# Patient Record
Sex: Female | Born: 1995 | Race: Black or African American | Hispanic: No | Marital: Single | Smoking: Never smoker
Health system: Southern US, Community
[De-identification: ages and names within clinical notes are randomized; demographics above are authoritative.]

## PROBLEM LIST (undated history)

## (undated) DIAGNOSIS — Z803 Family history of malignant neoplasm of breast: Secondary | ICD-10-CM

## (undated) DIAGNOSIS — Z8481 Family history of carrier of genetic disease: Secondary | ICD-10-CM

## (undated) HISTORY — DX: Family history of carrier of genetic disease: Z84.81

## (undated) HISTORY — DX: Family history of malignant neoplasm of breast: Z80.3

---
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
## 2015-01-13 ENCOUNTER — Encounter (HOSPITAL_COMMUNITY): Payer: Self-pay | Admitting: Emergency Medicine

## 2015-01-13 ENCOUNTER — Emergency Department (HOSPITAL_COMMUNITY)
Admission: EM | Admit: 2015-01-13 | Discharge: 2015-01-13 | Disposition: A | Discharge status: Home / Self Care | Payer: Medicaid Other | Attending: Emergency Medicine | Admitting: Emergency Medicine

## 2015-01-13 DIAGNOSIS — J029 Acute pharyngitis, unspecified: Secondary | ICD-10-CM

## 2015-01-13 LAB — RAPID STREP SCREEN (MED CTR MEBANE ONLY): Streptococcus, Group A Screen (Direct): NEGATIVE

## 2015-01-13 MED ORDER — NAPROXEN 500 MG PO TABS: 500.0000 mg | ORAL_TABLET | Freq: Two times a day (BID) | ORAL | Status: DC

## 2015-01-13 MED ORDER — PHENOL 1.4 % MT LIQD: 1.0000 | OROMUCOSAL | Status: DC | PRN

## 2015-01-13 NOTE — Discharge Instructions (Signed)
Your rapid strep screen is negative today. Your symptoms are likely secondary to a viral illness. Recommend naproxen and Chloraseptic spray as prescribed. Perform salt water gargles 3-4 times per day. Follow-up with a primary care doctor for recheck of symptoms. Be sure to drink plenty of fluids to prevent dehydration.  Pharyngitis Pharyngitis is redness, pain, and swelling (inflammation) of your pharynx.  CAUSES  Pharyngitis is usually caused by infection. Most of the time, these infections are from viruses (viral) and are part of a cold. However, sometimes pharyngitis is caused by bacteria (bacterial). Pharyngitis can also be caused by allergies. Viral pharyngitis may be spread from person to person by coughing, sneezing, and personal items or utensils (cups, forks, spoons, toothbrushes). Bacterial pharyngitis may be spread from person to person by more intimate contact, such as kissing.  SIGNS AND SYMPTOMS  Symptoms of pharyngitis include:   Sore throat.   Tiredness (fatigue).   Low-grade fever.   Headache.  Joint pain and muscle aches.  Skin rashes.  Swollen lymph nodes.  Plaque-like film on throat or tonsils (often seen with bacterial pharyngitis). DIAGNOSIS  Your health care provider will ask you questions about your illness and your symptoms. Your medical history, along with a physical exam, is often all that is needed to diagnose pharyngitis. Sometimes, a rapid strep test is done. Other lab tests may also be done, depending on the suspected cause.  TREATMENT  Viral pharyngitis will usually get better in 3-4 days without the use of medicine. Bacterial pharyngitis is treated with medicines that kill germs (antibiotics).  HOME CARE INSTRUCTIONS   Drink enough water and fluids to keep your urine clear or pale yellow.   Only take over-the-counter or prescription medicines as directed by your health care provider:   If you are prescribed antibiotics, make sure you finish  them even if you start to feel better.   Do not take aspirin.   Get lots of rest.   Gargle with 8 oz of salt water ( tsp of salt per 1 qt of water) as often as every 1-2 hours to soothe your throat.   Throat lozenges (if you are not at risk for choking) or sprays may be used to soothe your throat. SEEK MEDICAL CARE IF:   You have large, tender lumps in your neck.  You have a rash.  You cough up green, yellow-brown, or bloody spit. SEEK IMMEDIATE MEDICAL CARE IF:   Your neck becomes stiff.  You drool or are unable to swallow liquids.  You vomit or are unable to keep medicines or liquids down.  You have severe pain that does not go away with the use of recommended medicines.  You have trouble breathing (not caused by a stuffy nose). MAKE SURE YOU:   Understand these instructions.  Will watch your condition.  Will get help right away if you are not doing well or get worse. Document Released: 11/11/2005 Document Revised: 09/01/2013 Document Reviewed: 07/19/2013 [facility] Patient Information 2015 [place], [place]. This information is not intended to replace advice given to you by your health care provider. Make sure you discuss any questions you have with your health care provider. Salt Water Gargle This solution will help make your mouth and throat feel better. HOME CARE INSTRUCTIONS   Mix 1 teaspoon of salt in 8 ounces of warm water.  Gargle with this solution as much or often as you need or as directed. Swish and gargle gently if you have any sores or wounds in your  mouth.  Do not swallow this mixture. Document Released: 08/15/2004 Document Revised: 02/03/2012 Document Reviewed: 01/06/2009 [facility] Patient Information 2015 [place]. This information is not intended to replace advice given to you by your health care provider. Make sure you discuss any questions you have with your health care provider.

## 2015-01-13 NOTE — ED Provider Notes (Signed)
CSN: [id number]     Arrival date & time 01/13/15  2124 History   First MD Initiated Contact with Patient 01/13/15 2150     Chief Complaint  Patient presents with  . Sore Throat   Patient is a 19 y.o. female presenting with pharyngitis. The history is provided by the patient. No language interpreter was used.  Sore Throat  This chart was scribed for non-physician practitioner Antony MaduraKelly Sharanya Templin, PA-C, working with Toy CookeyMegan Docherty, MD, by Andrew Auaven Small, ED Scribe. This patient was seen in room WTR7/WTR7 and the patient's care was started at 11:18 PM.  [name] is a 19 y.o. female who presents to the Emergency Department complaining of sore throat that began 2-3 days ago. Pt states her throat has been irritated and burning, worse with drinking and eating. Pt has taken ibuprofen and used salt water gargles without relief. She has also tried hot tea with little effect. Pt denies fever, chills, congestion, and rhinorrhea. No inability to swallow or drooling. She denies sick contacts.   History reviewed. No pertinent past medical history. History reviewed. No pertinent past surgical history. History reviewed. No pertinent family history. History  Substance Use Topics  . Smoking status: Never Smoker   . Smokeless tobacco: Never Used  . Alcohol Use: No   OB History    No data available      Review of Systems  HENT: Positive for sore throat.   All other systems reviewed and are negative.   Allergies  Review of patient's allergies indicates no known allergies.  Home Medications   Prior to Admission medications   Medication Sig Start Date End Date Taking? Authorizing Provider  cyclobenzaprine (FLEXERIL) 5 MG tablet Take 1 tablet (5 mg total) by mouth once. 09/07/14   Nelia Shiobert L Beaton, MD  ibuprofen (ADVIL,MOTRIN) 800 MG tablet Take 800 mg by mouth once.    Historical Provider, MD  medroxyPROGESTERone (DEPO-PROVERA) 150 MG/ML injection Inject 150 mg into the muscle every 3 (three) months.     Historical Provider, MD  naproxen (NAPROSYN) 500 MG tablet Take 1 tablet (500 mg total) by mouth 2 (two) times daily. 01/13/15   Antony MaduraKelly Kyree Fedorko, PA-C  phenol (CHLORASEPTIC) 1.4 % LIQD Use as directed 1 spray in the mouth or throat as needed for throat irritation / pain. 01/13/15   Antony MaduraKelly Carmello Cabiness, PA-C   BP 110/60 mmHg  Pulse 74  Temp(Src) 97.3 F (36.3 C) (Oral)  Resp 13  SpO2 100%   Physical Exam  Constitutional: She is oriented to person, place, and time. She appears well-developed and well-nourished. No distress.  Nontoxic/nonseptic appearing  HENT:  Head: Normocephalic and atraumatic.  Mouth/Throat: Uvula is midline. Posterior oropharyngeal erythema present. No oropharyngeal exudate or posterior oropharyngeal edema.  Patient with mild posterior oropharyngeal erythema. Punctate ulcers appreciated on the soft palate. No posterior oropharyngeal edema. Uvula midline. Patient tolerating secretions without difficulty.  Eyes: Conjunctivae and EOM are normal. No scleral icterus.  Neck: Normal range of motion.  No nuchal rigidity or meningismus  Pulmonary/Chest: Effort normal. No respiratory distress.  Musculoskeletal: Normal range of motion.  Lymphadenopathy:    She has cervical adenopathy.  Neurological: She is alert and oriented to person, place, and time.  Skin: Skin is warm and dry. No rash noted. She is not diaphoretic. No erythema. No pallor.  Psychiatric: She has a normal mood and affect. Her behavior is normal.  Nursing note and vitals reviewed.   ED Course  Procedures (including critical care time) DIAGNOSTIC STUDIES: Oxygen  Saturation is 100% on RA, normal by my interpretation.    COORDINATION OF CARE: 11:18 PM- Pt advised of plan for treatment and pt agrees.  Labs Review Labs Reviewed  RAPID STREP SCREEN  CULTURE, GROUP A STREP    Imaging Review No results found.   EKG Interpretation None      MDM   Final diagnoses:  Viral pharyngitis    Pt afebrile without  tonsillar exudate, negative strep. Presents with mild cervical lymphadenopathy and dysphagia; diagnosis of viral pharyngitis. No abx indicated. Plan to d/c with symptomatic tx for pain. Did discuss importance of fluid hydration. Presentation not concerning for PTA or infxn spread to soft tissue. No trismus or uvula deviation. Specific return precautions discussed. Recommended PCP follow up. Patient agreeable to plan with no unaddressed concerns.  I personally performed the services described in this documentation, which was scribed in my presence. The recorded information has been reviewed and is accurate.   Filed Vitals:   01/13/15 2132  BP: 110/60  Pulse: 74  Temp: 97.3 F (36.3 C)  TempSrc: Oral  Resp: 13  SpO2: 100%     Antony Madura, PA-C 01/13/15 2321  Toy Cookey, MD 01/13/15 2352

## 2015-01-13 NOTE — ED Notes (Signed)
Pt reports for the past 2 or 3 days she has had a sore throat. Redness and irritation to back of throat.

## 2015-01-16 LAB — CULTURE, GROUP A STREP: Strep A Culture: NEGATIVE

## 2016-02-22 ENCOUNTER — Emergency Department (HOSPITAL_COMMUNITY)
Admission: EM | Admit: 2016-02-22 | Discharge: 2016-02-23 | Disposition: A | Discharge status: Home / Self Care | Payer: Medicaid Other | Attending: Emergency Medicine | Admitting: Emergency Medicine

## 2016-02-22 ENCOUNTER — Encounter (HOSPITAL_COMMUNITY): Payer: Self-pay | Admitting: Emergency Medicine

## 2016-02-22 DIAGNOSIS — R05 Cough: Secondary | ICD-10-CM | POA: Insufficient documentation

## 2016-02-22 DIAGNOSIS — R1110 Vomiting, unspecified: Secondary | ICD-10-CM | POA: Insufficient documentation

## 2016-02-22 DIAGNOSIS — R509 Fever, unspecified: Secondary | ICD-10-CM | POA: Diagnosis present

## 2016-02-22 DIAGNOSIS — R0602 Shortness of breath: Secondary | ICD-10-CM | POA: Insufficient documentation

## 2016-02-22 DIAGNOSIS — R079 Chest pain, unspecified: Secondary | ICD-10-CM | POA: Insufficient documentation

## 2016-02-22 DIAGNOSIS — R059 Cough, unspecified: Secondary | ICD-10-CM

## 2016-02-22 LAB — COMPREHENSIVE METABOLIC PANEL
ALT: 40 U/L (ref 14–54)
AST: 31 U/L (ref 15–41)
Albumin: 4.1 g/dL (ref 3.5–5.0)
Alkaline Phosphatase: 58 U/L (ref 38–126)
Anion gap: 9 (ref 5–15)
BILIRUBIN TOTAL: 0.3 mg/dL (ref 0.3–1.2)
CO2: 21 mmol/L — ABNORMAL LOW (ref 22–32)
Calcium: 9.1 mg/dL (ref 8.9–10.3)
Chloride: 106 mmol/L (ref 101–111)
Creatinine, Ser: 0.81 mg/dL (ref 0.44–1.00)
GFR calc non Af Amer: >60 mL/min (ref >60)
Glucose, Bld: 108 mg/dL — ABNORMAL HIGH (ref 65–99)
Potassium: 3.6 mmol/L (ref 3.5–5.1)
Sodium: 136 mmol/L (ref 135–145)
TOTAL PROTEIN: 7.2 g/dL (ref 6.5–8.1)

## 2016-02-22 LAB — LIPASE, BLOOD: Lipase: 33 U/L (ref 11–51)

## 2016-02-22 MED ORDER — ONDANSETRON 4 MG PO TBDP
4.0000 mg | ORAL_TABLET | Freq: Once | ORAL | Status: AC | PRN
  Administered 2016-02-22: 4 mg via ORAL
  Filled 2016-02-22: qty 1

## 2016-02-22 MED ORDER — ACETAMINOPHEN 325 MG PO TABS
650.0000 mg | ORAL_TABLET | Freq: Once | ORAL | Status: AC | PRN
  Administered 2016-02-22: 650 mg via ORAL
  Filled 2016-02-22: qty 2

## 2016-02-22 NOTE — ED Notes (Signed)
Pt c/o a cold x 1 week with worsening os symptoms over the past two days. Pt sts that two days ago she began vomiting and feeling weak with generalized body aches. Pt has been taking Tylenol at home. Last dose was at 9am this morning. A&Ox4 and ambulatory. Pt c/o chest pain that worsens with palpation and big movements.

## 2016-02-23 ENCOUNTER — Emergency Department (HOSPITAL_COMMUNITY): Payer: Medicaid Other

## 2016-02-23 LAB — URINE MICROSCOPIC-ADD ON

## 2016-02-23 LAB — URINALYSIS, ROUTINE W REFLEX MICROSCOPIC
Bilirubin Urine: NEGATIVE
Glucose, UA: NEGATIVE mg/dL
KETONES UR: 15 mg/dL — AB
Leukocytes, UA: NEGATIVE
NITRITE: NEGATIVE
Protein, ur: NEGATIVE mg/dL
SPECIFIC GRAVITY, URINE: 1.013 (ref 1.005–1.030)
pH: 6 (ref 5.0–8.0)

## 2016-02-23 LAB — CBC WITH DIFFERENTIAL/PLATELET
Basophils Absolute: 0 10*3/uL (ref 0.0–0.1)
Basophils Relative: 0 %
Eosinophils Absolute: 0 10*3/uL (ref 0.0–0.7)
Eosinophils Relative: 0 %
HCT: 38.1 % (ref 36.0–46.0)
HEMOGLOBIN: 12.7 g/dL (ref 12.0–15.0)
LYMPHS ABS: 0.6 10*3/uL — AB (ref 0.7–4.0)
LYMPHS PCT: 9 %
MCH: 27.4 pg (ref 26.0–34.0)
MCHC: 33.3 g/dL (ref 30.0–36.0)
MCV: 82.1 fL (ref 78.0–100.0)
Monocytes Absolute: 1 10*3/uL (ref 0.1–1.0)
Monocytes Relative: 15 %
NEUTROS ABS: 5.2 10*3/uL (ref 1.7–7.7)
NEUTROS PCT: 76 %
Platelets: 143 10*3/uL — ABNORMAL LOW (ref 150–400)
RBC: 4.64 MIL/uL (ref 3.87–5.11)
RDW: 12.5 % (ref 11.5–15.5)
WBC: 6.9 10*3/uL (ref 4.0–10.5)

## 2016-02-23 LAB — D-DIMER, QUANTITATIVE: D-Dimer, Quant: <0.27 ug/mL-FEU (ref 0.00–0.50)

## 2016-02-23 MED ORDER — AZITHROMYCIN 250 MG PO TABS: 250.0000 mg | ORAL_TABLET | Freq: Every day | ORAL | Status: DC

## 2016-02-23 MED ORDER — ONDANSETRON 8 MG PO TBDP
8.0000 mg | ORAL_TABLET | Freq: Once | ORAL | Status: AC
  Administered 2016-02-23: 8 mg via ORAL
  Filled 2016-02-23: qty 1

## 2016-02-23 NOTE — ED Notes (Signed)
Pt transported to XR.  

## 2016-02-23 NOTE — Discharge Instructions (Signed)
Upper Respiratory Infection, Adult Most upper respiratory infections (URIs) are a viral infection of the air passages leading to the lungs. A URI affects the nose, throat, and upper air passages. The most common type of URI is nasopharyngitis and is typically referred to as "the common cold." URIs run their course and usually go away on their own. Most of the time, a URI does not require medical attention, but sometimes a bacterial infection in the upper airways can follow a viral infection. This is called a secondary infection. Sinus and middle ear infections are common types of secondary upper respiratory infections. Bacterial pneumonia can also complicate a URI. A URI can worsen asthma and chronic obstructive pulmonary disease (COPD). Sometimes, these complications can require emergency medical care and may be life threatening.  CAUSES Almost all URIs are caused by viruses. A virus is a type of germ and can spread from one person to another.  RISKS FACTORS You may be at risk for a URI if:   You smoke.   You have chronic heart or lung disease.  You have a weakened defense (immune) system.   You are very young or very old.   You have nasal allergies or asthma.  You work in crowded or poorly ventilated areas.  You work in health care facilities or schools. SIGNS AND SYMPTOMS  Symptoms typically develop 2-3 days after you come in contact with a cold virus. Most viral URIs last 7-10 days. However, viral URIs from the influenza virus (flu virus) can last 14-18 days and are typically more severe. Symptoms may include:   Runny or stuffy (congested) nose.   Sneezing.   Cough.   Sore throat.   Headache.   Fatigue.   Fever.   Loss of appetite.   Pain in your forehead, behind your eyes, and over your cheekbones (sinus pain).  Muscle aches.  DIAGNOSIS  Your health care provider may diagnose a URI by:  Physical exam.  Tests to check that your symptoms are not due to  another condition such as:  Strep throat.  Sinusitis.  Pneumonia.  Asthma. TREATMENT  A URI goes away on its own with time. It cannot be cured with medicines, but medicines may be prescribed or recommended to relieve symptoms. Medicines may help:  Reduce your fever.  Reduce your cough.  Relieve nasal congestion. HOME CARE INSTRUCTIONS   Take medicines only as directed by your health care provider.   Gargle warm saltwater or take cough drops to comfort your throat as directed by your health care provider.  Use a warm mist humidifier or inhale steam from a shower to increase air moisture. This may make it easier to breathe.  Drink enough fluid to keep your urine clear or pale yellow.   Eat soups and other clear broths and maintain good nutrition.   Rest as needed.   Return to work when your temperature has returned to normal or as your health care provider advises. You may need to stay home longer to avoid infecting others. You can also use a face mask and careful hand washing to prevent spread of the virus.  Increase the usage of your inhaler if you have asthma.   Do not use any tobacco products, including cigarettes, chewing tobacco, or electronic cigarettes. If you need help quitting, ask your health care provider. PREVENTION  The best way to protect yourself from getting a cold is to practice good hygiene.   Avoid oral or hand contact with people with cold   symptoms.   Wash your hands often if contact occurs.  There is no clear evidence that vitamin C, vitamin E, echinacea, or exercise reduces the chance of developing a cold. However, it is always recommended to get plenty of rest, exercise, and practice good nutrition.  SEEK MEDICAL CARE IF:   You are getting worse rather than better.   Your symptoms are not controlled by medicine.   You have chills.  You have worsening shortness of breath.  You have brown or red mucus.  You have yellow or brown nasal  discharge.  You have pain in your face, especially when you bend forward.  You have a fever.  You have swollen neck glands.  You have pain while swallowing.  You have white areas in the back of your throat. SEEK IMMEDIATE MEDICAL CARE IF:   You have severe or persistent:  Headache.  Ear pain.  Sinus pain.  Chest pain.  You have chronic lung disease and any of the following:  Wheezing.  Prolonged cough.  Coughing up blood.  A change in your usual mucus.  You have a stiff neck.  You have changes in your:  Vision.  Hearing.  Thinking.  Mood. MAKE SURE YOU:   Understand these instructions.  Will watch your condition.  Will get help right away if you are not doing well or get worse.   This information is not intended to replace advice given to you by your health care provider. Make sure you discuss any questions you have with your health care provider.   Document Released: 05/07/2001 Document Revised: 03/28/2015 Document Reviewed: 02/16/2014 Elsevier Interactive Patient Education 2016 Elsevier Inc.  

## 2016-02-23 NOTE — ED Notes (Signed)
Pt unable to sign. E signature pad not working.

## 2016-02-23 NOTE — ED Provider Notes (Signed)
CSN: [id number]     Arrival date & time 02/22/16  [id number] History  By signing my name below, I, Linus GalasMaharshi Patel, attest that this documentation has been prepared under the direction and in the presence of Azalia BilisKevin Dionel Archey, MD. Electronically Signed: Linus GalasMaharshi Patel, ED Scribe. 02/23/2016. 1:56 AM.   Chief Complaint  Patient presents with  . Fever  . Emesis   The history is provided by the patient. No language interpreter was used.    HPI Comments: Danielle Fry is a 20 y.o. female who presents to the Emergency Department with no pertinent PMHx complaining of CP that began today that is worse with ambulation. Pt also reports fever, dry cough, SOB, and 1 day of vomiting. Pt took Tylenol 9 am this morning, with no relief. Pt denies leg swelling or any other symptoms at this time. Pt is on birth control. Pt denies history of PMHx of blood clots.   History reviewed. No pertinent past medical history. History reviewed. No pertinent past surgical history. No family history on file. Social History  Substance Use Topics  . Smoking status: Never Smoker   . Smokeless tobacco: Never Used  . Alcohol Use: No   OB History    No data available     Review of Systems   A complete 10 system review of systems was obtained and all systems are negative except as noted in the HPI and PMH.   Allergies  Review of patient's allergies indicates no known allergies.  Home Medications   Prior to Admission medications   Medication Sig Start Date End Date Taking? Authorizing Provider  acetaminophen (TYLENOL) 500 MG tablet Take 1,000 mg by mouth every 6 (six) hours as needed for mild pain, moderate pain or headache.   Yes Historical Provider, MD   BP 125/67 mmHg  Pulse 103  Temp(Src) 99.1 F (37.3 C) (Oral)  Resp 16  SpO2 99%  LMP 02/08/2016   Physical Exam  Constitutional: She is oriented to person, place, and time. She appears well-developed and well-nourished. No distress.  HENT:  Head: Normocephalic  and atraumatic.  Eyes: EOM are normal.  Neck: Normal range of motion.  Cardiovascular: Normal rate, regular rhythm and normal heart sounds.   Pulmonary/Chest: Effort normal and breath sounds normal.  Abdominal: Soft. She exhibits no distension. There is no tenderness.  Musculoskeletal: Normal range of motion.  Neurological: She is alert and oriented to person, place, and time.  Skin: Skin is warm and dry.  Psychiatric: She has a normal mood and affect. Judgment normal.  Nursing note and vitals reviewed.   ED Course  Procedures   DIAGNOSTIC STUDIES: Oxygen Saturation is 99% on room air, normal by my interpretation.    COORDINATION OF CARE: 1:51 AM Will give antiemetic. Will order blood work and CXR. Discussed treatment plan with pt at bedside and pt agreed to plan.   Labs Review Labs Reviewed  COMPREHENSIVE METABOLIC PANEL - Abnormal; Notable for the following:    CO2 21 (*)    Glucose, Bld 108 (*)    BUN <5 (*)    All other components within normal limits  URINALYSIS, ROUTINE W REFLEX MICROSCOPIC (NOT AT [facility]) - Abnormal; Notable for the following:    Hgb urine dipstick SMALL (*)    Ketones, ur 15 (*)    All other components within normal limits  CBC WITH DIFFERENTIAL/PLATELET - Abnormal; Notable for the following:    Platelets 143 (*)    Lymphs Abs 0.6 (*)  All other components within normal limits  URINE MICROSCOPIC-ADD ON - Abnormal; Notable for the following:    Squamous Epithelial / LPF 6-30 (*)    Bacteria, UA FEW (*)    All other components within normal limits  LIPASE, BLOOD  D-DIMER, QUANTITATIVE (NOT AT [facility])    Imaging Review Dg Chest 2 View  02/23/2016  CLINICAL DATA:  Acute onset of fever, chills, vomiting and body aches. Initial encounter. EXAM: CHEST  2 VIEW COMPARISON:  Chest radiograph from 09/07/2014 FINDINGS: The lungs are well-aerated and clear. There is no evidence of focal opacification, pleural effusion or pneumothorax. The heart is normal in  size; the mediastinal contour is within normal limits. No acute osseous abnormalities are seen. IMPRESSION: No acute cardiopulmonary process seen. Electronically Signed   By: Roanna Raider M.D.   On: 02/23/2016 02:47   I have personally reviewed and evaluated these images and lab results as part of my medical decision-making.   EKG Interpretation None      MDM   Final diagnoses:  None    Overall the patient is well-appearing.  Patient with fever today.  Likely bronchitis with possible developing pneumonia given her mild shortness of breath and cough.  Patient be started on azithromycin.  Chest x-rays without obvious infiltrate right now.  I personally performed the services described in this documentation, which was scribed in my presence. The recorded information has been reviewed and is accurate.       Azalia Bilis, MD 02/23/16 [phone]

## 2016-08-20 IMAGING — CR DG CHEST 2V
2 series · 2 of 2 positions shown · non-contrast
Comparison: Chest radiograph from 09/07/2014

CLINICAL DATA: Acute onset of fever, chills, vomiting and body
aches. Initial encounter.

EXAM:
CHEST  2 VIEW

[w chest pa]
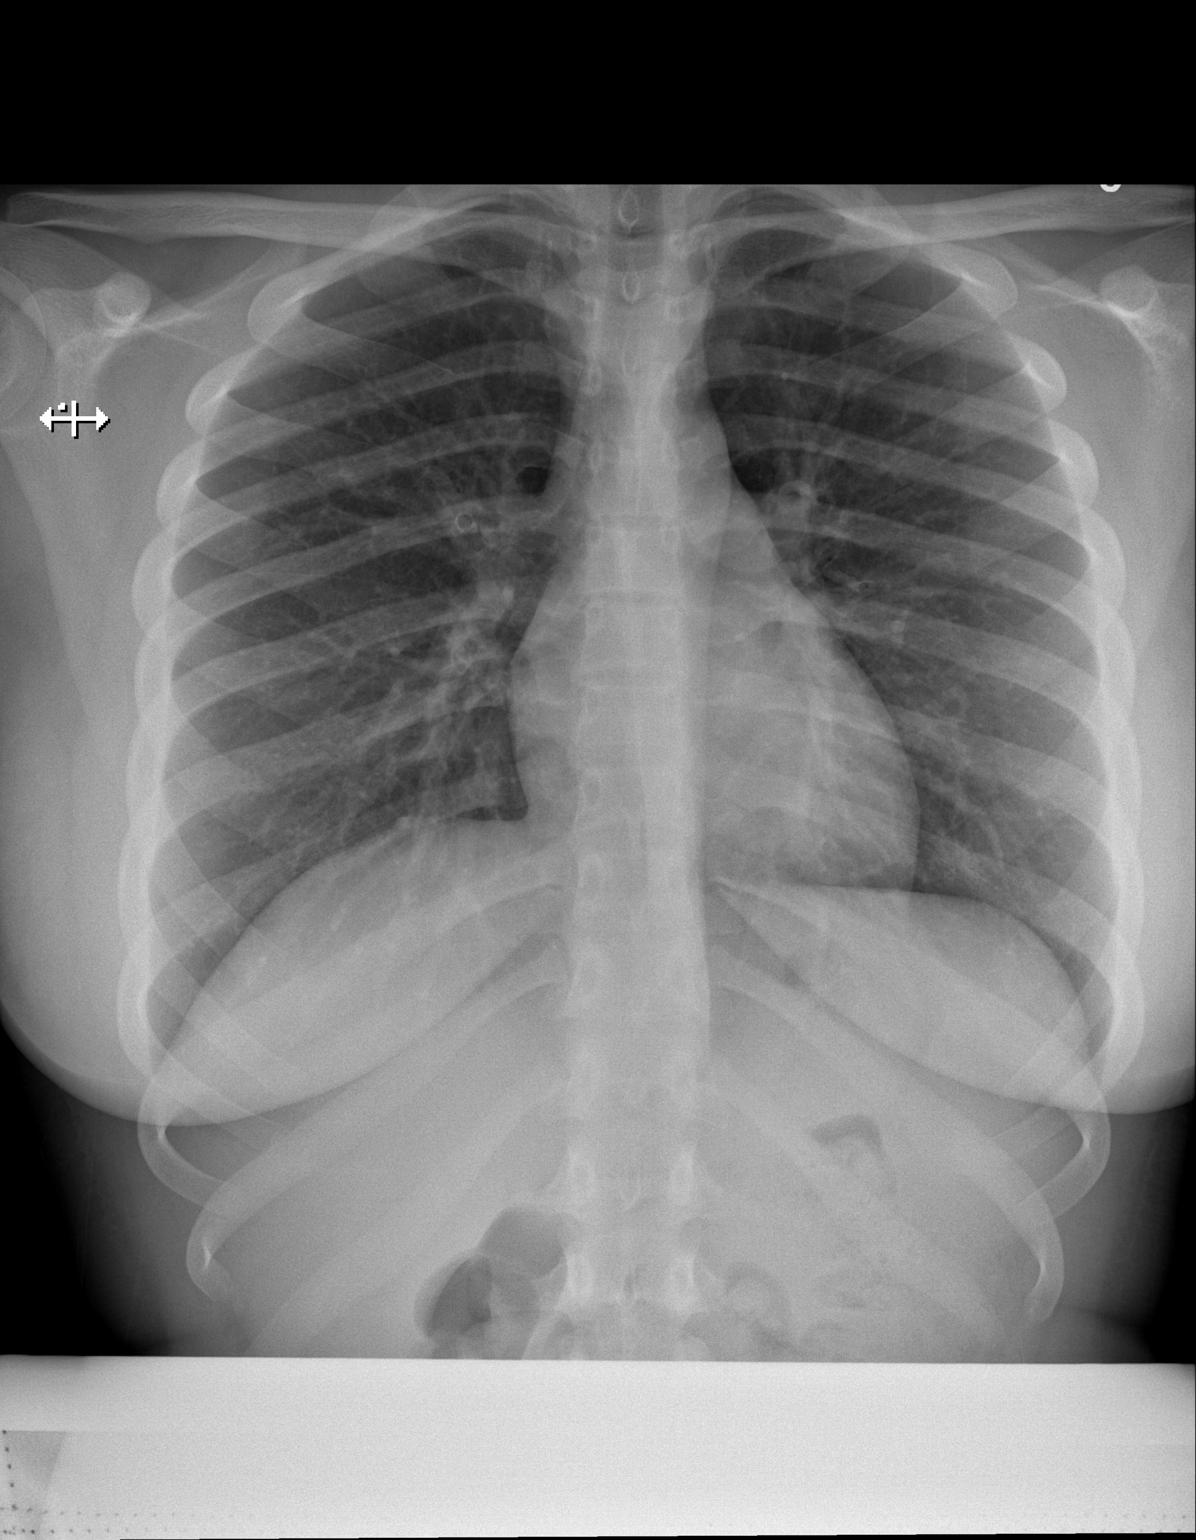

[w chest lat]
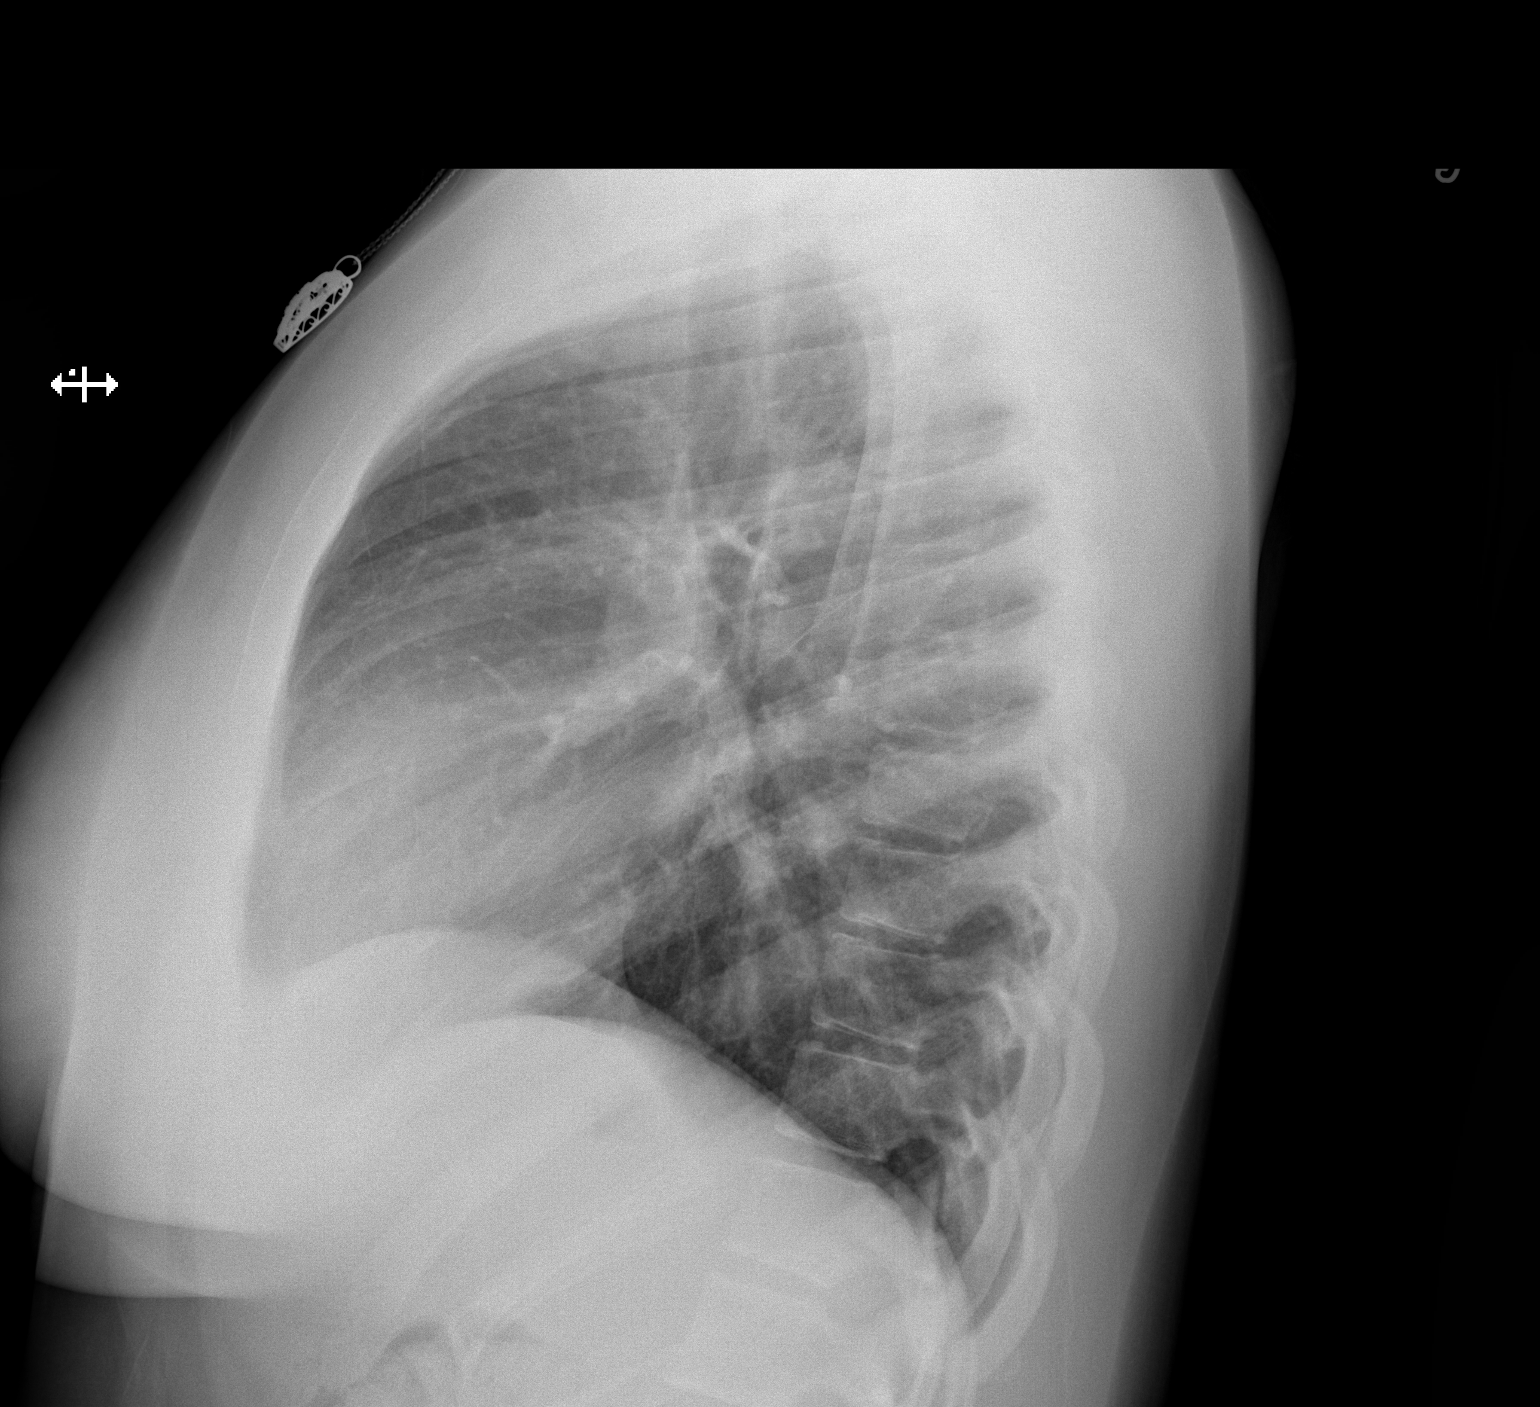

[2 of 2 positions shown; findings below may reference images not displayed]

FINDINGS: The lungs are well-aerated and clear. There is no evidence of focal
opacification, pleural effusion or pneumothorax.

The heart is normal in size; the mediastinal contour is within
normal limits. No acute osseous abnormalities are seen.
IMPRESSION: No acute cardiopulmonary process seen.

## 2017-01-15 ENCOUNTER — Emergency Department (HOSPITAL_COMMUNITY)
Admission: EM | Admit: 2017-01-15 | Discharge: 2017-01-15 | Disposition: A | Discharge status: Home / Self Care | Payer: No Typology Code available for payment source | Attending: Emergency Medicine | Admitting: Emergency Medicine

## 2017-01-15 ENCOUNTER — Encounter (HOSPITAL_COMMUNITY): Payer: Self-pay

## 2017-01-15 DIAGNOSIS — M546 Pain in thoracic spine: Secondary | ICD-10-CM | POA: Diagnosis not present

## 2017-01-15 DIAGNOSIS — Y999 Unspecified external cause status: Secondary | ICD-10-CM | POA: Diagnosis not present

## 2017-01-15 DIAGNOSIS — Y92410 Unspecified street and highway as the place of occurrence of the external cause: Secondary | ICD-10-CM | POA: Insufficient documentation

## 2017-01-15 DIAGNOSIS — Y939 Activity, unspecified: Secondary | ICD-10-CM | POA: Diagnosis not present

## 2017-01-15 DIAGNOSIS — M542 Cervicalgia: Secondary | ICD-10-CM | POA: Insufficient documentation
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
## 2017-01-15 NOTE — Discharge Instructions (Signed)
Please read attached information. If you experience any new or worsening signs or symptoms please return to the emergency room for evaluation. Please follow-up with your primary care provider or specialist as discussed.  Please use Tylenol or ibuprofen as needed for discomfort. °
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
## 2017-01-18 ENCOUNTER — Encounter (HOSPITAL_COMMUNITY): Payer: Self-pay | Admitting: Emergency Medicine

## 2017-01-18 DIAGNOSIS — G44209 Tension-type headache, unspecified, not intractable: Secondary | ICD-10-CM | POA: Insufficient documentation

## 2017-01-18 DIAGNOSIS — R51 Headache: Secondary | ICD-10-CM | POA: Diagnosis present

## 2017-01-18 DIAGNOSIS — M546 Pain in thoracic spine: Secondary | ICD-10-CM | POA: Insufficient documentation
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
## 2017-01-19 ENCOUNTER — Emergency Department (HOSPITAL_COMMUNITY)
Admission: EM | Admit: 2017-01-19 | Discharge: 2017-01-19 | Disposition: A | Discharge status: Home / Self Care | Payer: No Typology Code available for payment source | Attending: Emergency Medicine | Admitting: Emergency Medicine

## 2017-01-19 DIAGNOSIS — G44209 Tension-type headache, unspecified, not intractable: Secondary | ICD-10-CM

## 2017-01-19 DIAGNOSIS — M7918 Myalgia, other site: Secondary | ICD-10-CM

## 2017-01-19 MED ORDER — CYCLOBENZAPRINE HCL 10 MG PO TABS: 10.0000 mg | ORAL_TABLET | Freq: Two times a day (BID) | ORAL | 0 refills | Status: AC | PRN
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
## 2017-12-30 ENCOUNTER — Other Ambulatory Visit (HOSPITAL_COMMUNITY)
Admission: RE | Admit: 2017-12-30 | Discharge: 2017-12-30 | Disposition: A | Discharge status: Home / Self Care | Payer: 59 | Source: Ambulatory Visit | Attending: Obstetrics and Gynecology | Admitting: Obstetrics and Gynecology

## 2017-12-30 ENCOUNTER — Other Ambulatory Visit: Payer: Self-pay | Admitting: Obstetrics and Gynecology

## 2017-12-30 DIAGNOSIS — Z01419 Encounter for gynecological examination (general) (routine) without abnormal findings: Secondary | ICD-10-CM | POA: Insufficient documentation

## 2017-12-31 LAB — CYTOLOGY - PAP: DIAGNOSIS: NEGATIVE

## 2019-10-19 ENCOUNTER — Other Ambulatory Visit: Payer: Self-pay | Admitting: Obstetrics and Gynecology

## 2019-10-19 ENCOUNTER — Other Ambulatory Visit (HOSPITAL_COMMUNITY)
Admission: RE | Admit: 2019-10-19 | Discharge: 2019-10-19 | Disposition: A | Discharge status: Home / Self Care | Payer: 59 | Source: Ambulatory Visit | Attending: Obstetrics and Gynecology | Admitting: Obstetrics and Gynecology

## 2019-10-19 DIAGNOSIS — Z01419 Encounter for gynecological examination (general) (routine) without abnormal findings: Secondary | ICD-10-CM | POA: Insufficient documentation

## 2019-10-25 LAB — CYTOLOGY - PAP: Diagnosis: NEGATIVE

## 2021-01-12 ENCOUNTER — Telehealth: Payer: Self-pay | Admitting: Genetic Counselor

## 2021-01-12 NOTE — Telephone Encounter (Signed)
Received a geentic counseling referral from [place] for fhx of brca. Danielle Fry returned my call and has been scheduled to see [name] on 3/3 at 3pm for a mychart video visit.

## 2021-01-25 ENCOUNTER — Inpatient Hospital Stay: Payer: No Typology Code available for payment source | Attending: Genetic Counselor | Admitting: Genetic Counselor

## 2021-01-25 ENCOUNTER — Encounter: Payer: Self-pay | Admitting: Genetic Counselor

## 2021-01-25 DIAGNOSIS — Z8481 Family history of carrier of genetic disease: Secondary | ICD-10-CM

## 2021-01-25 DIAGNOSIS — Z803 Family history of malignant neoplasm of breast: Secondary | ICD-10-CM

## 2021-01-25 NOTE — Progress Notes (Signed)
REFERRING PROVIDER: Ma Hillock, [place],  [place] [address]  PRIMARY PROVIDER:  System, Provider Not In  PRIMARY REASON FOR VISIT:  1. Family history of breast cancer   2. Family history of gene mutation     I connected with Ms. Alcindor on 01/25/2021 at 3:00 pm EDT by video conference and verified that I am speaking with the correct person using two identifiers.   Patient location: Home Provider location: [place] [facility]   HISTORY OF PRESENT ILLNESS:   Ms. Gotcher, a 25 y.o. female, was seen for a [facility] cancer genetics consultation at the request of Dr. Baldo Ash due to a family history of breast cancer and a breast/ovarian cancer gene mutation.  Ms. Mcmiller presents to clinic today to discuss the possibility of a hereditary predisposition to cancer, genetic testing, and to further clarify her future cancer risks, as well as potential cancer risks for family members.   Ms. Strohecker does not have a personal history of cancer.    RISK FACTORS:  Menarche was at age 76-13.  No live births.  OCP use for a few years.  Ovaries intact: yes.  Hysterectomy: no.  Menopausal status: premenopausal.  HRT use: 0 years. Colonoscopy: no; not examined. Mammogram within the last year: no. Number of breast biopsies: 0. Up to date with pelvic exams: yes. Any excessive radiation exposure in the past: no.  Past Medical History:  Diagnosis Date  . Family history of breast cancer   . Family history of gene mutation     No past surgical history on file.  Social History   Socioeconomic History  . Marital status: Single    Spouse name: Not on file  . Number of children: Not on file  . Years of education: Not on file  . Highest education level: Not on file  Occupational History  . Not on file  Tobacco Use  . Smoking status: Never Smoker  . Smokeless tobacco: Never Used  Substance and Sexual Activity  . Alcohol use: No  . Drug use:  No  . Sexual activity: Not on file  Other Topics Concern  . Not on file  Social History Narrative  . Not on file   Social Determinants of Health   Financial Resource Strain: Not on file  Food Insecurity: Not on file  Transportation Needs: Not on file  Physical Activity: Not on file  Stress: Not on file  Social Connections: Not on file     FAMILY HISTORY:  We obtained a detailed, 4-generation family history.  Significant diagnoses are listed below: Family History  Problem Relation Age of Onset  . Breast cancer Mother 33  . Other Half-Sister        breast/ovarian cancer gene mutation  . Other Maternal Aunt        breast/ovarian cancer gene mutation    Ms. Hellmann does not have children. She has one maternal half-brother (age 3), and two sisters (ages 106 and 89). None of these relatives have had cancer. One sister had positive genetic testing for a gene mutation associated with breast/ovarian/cervical cancer. The other sister also had genetic testing that was negative. These results were not available for review today.  Ms. Armon mother died at age 25 from breast cancer that was first diagnosed at age 71. There were multiple maternal aunts and uncles, including one maternal half-aunt and one maternal uncle that Ms. Coppinger is familiar with. This aunt had positive genetic  testing for a breast/ovarian cancer gene mutation, and had pre-cancerous cells removed from her cervix. There is no known cancer among maternal aunts/uncles or maternal cousins. Ms. Sabel maternal grandmother is alive at age 59 without cancer. She does not have information about her maternal grandfather.  Ms. Basso father died at age 36 without cancer. There are five paternal aunts and two paternal uncles. There is no known cancer among paternal aunts/uncles or paternal cousins. Ms. Jolly paternal grandmother is alive at age 64 without cancer. Her paternal grandfather died in his 96s without cancer.  Ms.  Vorce is aware of previous family history of genetic testing for hereditary cancer risks. Patient's maternal ancestors are of unknown and part Native American descent, and paternal ancestors are of unknown descent. There is no reported Ashkenazi Jewish ancestry. There is no known consanguinity.  GENETIC COUNSELING ASSESSMENT: Ms. Styles is a 25 y.o. female with a family history of young-onset breast cancer and a known cancer gene mutation. We, therefore, discussed and recommended the following at today's visit.   DISCUSSION:  We discussed that, in order to determine her risk for breast cancer and her chance to have inherited the familial cancer gene mutation, it will be important to obtain a copy of her sister and aunt's genetic test results, if possible. If her sister and her maternal aunt had the same genetic mutation, Ms. Bouchard has a 50% (1 in 2) chance to also have the familial gene mutation.  We discussed that most cases of hereditary breast and ovarian cancer are associated with BRCA1 and BRCA2 genes. There are other genes that can be associated with hereditary breast and/or ovarian cancer syndromes. These include ATM, CHEK2, PALB2, RAD51C, RAD51D, etc. We discussed that testing is beneficial for several reasons, including knowing about other cancer risks, identifying potential screening and risk-reduction options that may be appropriate, and to understand if other family members could be at risk for cancer and allow them to undergo genetic testing.  We reviewed the characteristics, features and inheritance patterns of hereditary cancer syndromes. We also discussed genetic testing, including the appropriate family members to test, the process of testing, insurance coverage and turn-around-time for results. We discussed the implications of a negative, positive and/or variant of uncertain significant result. In particular, we discussed that even if her results are negative, she may still be at an  increased risk for breast cancer based on the family history, depending on the familial mutation. We recommended Ms. Guarino pursue genetic testing for the [facility] CancerNext-Expanded + RNA insight panel.   The CancerNext-Expanded + RNAinsight gene panel offered by [employer] and includes sequencing and rearrangement analysis for the following 77 genes: AIP, ALK, APC, ATM, AXIN2, BAP1, BARD1, BLM, BMPR1A, BRCA1, BRCA2, BRIP1, CDC73, CDH1, CDK4, CDKN1B, CDKN2A, CHEK2, CTNNA1, DICER1, FANCC, FH, FLCN, GALNT12, KIF1B, LZTR1, MAX, MEN1, MET, MLH1, MSH2, MSH3, MSH6, MUTYH, NBN, NF1, NF2, NTHL1, PALB2, PHOX2B, PMS2, POT1, PRKAR1A, PTCH1, PTEN, RAD51C, RAD51D, RB1, RECQL, RET, SDHA, SDHAF2, SDHB, SDHC, SDHD, SMAD4, SMARCA4, SMARCB1, SMARCE1, STK11, SUFU, TMEM127, TP53, TSC1, TSC2, VHL and XRCC2 (sequencing and deletion/duplication); EGFR, EGLN1, HOXB13, KIT, MITF, PDGFRA, POLD1 and POLE (sequencing only); EPCAM and GREM1 (deletion/duplication only). RNA data is routinely analyzed for use in variant interpretation for all genes.  Based on Ms. Stancil's family history of cancer and a known gene mutation, she meets medical criteria for genetic testing. Despite that she meets criteria, there may still be an out of pocket cost. We discussed that if her out of  pocket cost for testing is over $100, the laboratory will reach out to let her know. If the out of pocket cost of testing is less than $100 she will be billed by the genetic testing laboratory.  We discussed that some people do not want to undergo genetic testing due to fear of genetic discrimination. A federal law called the Genetic Information Non-Discrimination Act (GINA) of 2008 helps protect individuals against genetic discrimination based on their genetic test results. It impacts both health insurance and employment. With health insurance, it protects against increased premiums, being kicked off insurance or being forced to take a test in order to be insured.   For employment it protects against hiring, firing and promoting decisions based on genetic test results. Health status due to a cancer diagnosis is not protected under GINA. Importantly, life, disability, and long-term care insurance is not protected under GINA.   PLAN: After considering the risks, benefits, and limitations, Ms. Longsworth provided informed consent to pursue genetic testing and the blood sample will be drawn on 01/29/21, and will be sent to [facility] for analysis of the CancerNext-Expanded + RNAinsight panel. Results should be available within approximately two-three weeks' time, at which point they will be disclosed by telephone to Ms. Kaylor, as will any additional recommendations warranted by these results. Ms. Peltzer will receive a summary of her genetic counseling visit and a copy of her results once available. This information will also be available in Epic.   Ms. Houchin questions were answered to her satisfaction today. Our contact information was provided should additional questions or concerns arise. Thank you for the referral and allowing [place] to share in the care of your patient.   Clint Guy, [place], [facility] Licensed, Certified Dispensing [email or web address] Phone: [phone]  The patient was seen for a total of 30 minutes in face-to-face genetic counseling.  This patient was discussed with Drs. Magrinat, Lindi Adie and/or Burr Medico who agrees with the above.    _______________________________________________________________________ For Office Staff:  Number of people involved in session: 1 Was an Intern/ student involved with case: no

## 2021-01-29 ENCOUNTER — Other Ambulatory Visit: Payer: No Typology Code available for payment source

## 2021-01-30 ENCOUNTER — Inpatient Hospital Stay: Payer: No Typology Code available for payment source

## 2021-01-30 ENCOUNTER — Other Ambulatory Visit: Payer: Self-pay

## 2021-02-23 ENCOUNTER — Encounter: Payer: Self-pay | Admitting: Genetic Counselor

## 2021-02-23 ENCOUNTER — Telehealth: Payer: Self-pay | Admitting: Genetic Counselor

## 2021-02-23 ENCOUNTER — Ambulatory Visit: Payer: Self-pay | Admitting: Genetic Counselor

## 2021-02-23 DIAGNOSIS — Z1379 Encounter for other screening for genetic and chromosomal anomalies: Secondary | ICD-10-CM | POA: Insufficient documentation

## 2021-02-23 NOTE — Progress Notes (Signed)
HPI:  Danielle Fry was previously seen in the [facility] due to a family history of breast cancer and a known RAD51C gene mutation, and concerns regarding a hereditary predisposition to cancer. Please refer to our prior cancer genetics clinic note for more information regarding our discussion, assessment and recommendations, at the time. Danielle Fry recent genetic test results were disclosed to her, as were recommendations warranted by these results. These results and recommendations are discussed in more detail below.  FAMILY HISTORY:  We obtained a detailed, 4-generation family history.  Significant diagnoses are listed below: Family History  Problem Relation Age of Onset  . Breast cancer Mother 3  . Other Half-Sister        RAD51C gene mutation positive  . Other Maternal Aunt        RAD51C gene mutation positive  . Other Sister        RAD51C gene mutation positive   Ms. Bingman does not have children. She has one maternal half-brother (age 89), and two sisters (ages 83 and 53). None of these relatives have had cancer. Both sisters had positive genetic testing for a RAD51C mutation called c.97C>T (results reviewed).  Ms. Urquidi mother died at age 43 from breast cancer that was first diagnosed at age 12. There were multiple maternal aunts and uncles, including one maternal half-aunt and one maternal uncle that Ms. Secord is familiar with. This aunt had positive genetic testing for the RAD51C mutation, and had pre-cancerous cells removed from her cervix. There is no known cancer among maternal aunts/uncles or maternal cousins. Ms. Stille maternal grandmother is alive at age 24 without cancer. She does not have information about her maternal grandfather.  Ms. Nabi father died at age 77 without cancer. There are five paternal aunts and two paternal uncles. There is no known cancer among paternal aunts/uncles or paternal cousins. Ms. Priego paternal grandmother is  alive at age 5 without cancer. Her paternal grandfather died in his 78s without cancer.  Ms. Golomb is aware of previous family history of genetic testing for hereditary cancer risks. Patient's maternal ancestors are of unknown and part Native American descent, and paternal ancestors are of unknown descent. There is no reported Ashkenazi Jewish ancestry. There is no known consanguinity.  GENETIC TEST RESULTS: Genetic testing reported out on 02/21/2021 through the [place] + RNAinsight panel. No pathogenic variants were detected.   The [facility]Next-Expanded + RNAinsight gene panel offered by [employer] and includes sequencing and rearrangement analysis for the following 77 genes: AIP, ALK, APC, ATM, AXIN2, BAP1, BARD1, BLM, BMPR1A, BRCA1, BRCA2, BRIP1, CDC73, CDH1, CDK4, CDKN1B, CDKN2A, CHEK2, CTNNA1, DICER1, FANCC, FH, FLCN, GALNT12, KIF1B, LZTR1, MAX, MEN1, MET, MLH1, MSH2, MSH3, MSH6, MUTYH, NBN, NF1, NF2, NTHL1, PALB2, PHOX2B, PMS2, POT1, PRKAR1A, PTCH1, PTEN, RAD51C, RAD51D, RB1, RECQL, RET, SDHA, SDHAF2, SDHB, SDHC, SDHD, SMAD4, SMARCA4, SMARCB1, SMARCE1, STK11, SUFU, TMEM127, TP53, TSC1, TSC2, VHL and XRCC2 (sequencing and deletion/duplication); EGFR, EGLN1, HOXB13, KIT, MITF, PDGFRA, POLD1 and POLE (sequencing only); EPCAM and GREM1 (deletion/duplication only). RNA data is routinely analyzed for use in variant interpretation for all genes. The test report will be scanned into EPIC and located under the Molecular Pathology section of the Results Review tab.  A portion of the result report is included below for reference.     We discussed with Ms. Tatro that because current genetic testing is not perfect, it is possible there may be a gene mutation in one of these genes that current testing  cannot detect, but that chance is small.  We also discussed that there could be another gene that has not yet been discovered, or that we have not yet tested, that is responsible for the cancer  diagnoses in the family. It is also possible there is a hereditary cause for the cancer in the family that Ms. Hamada did not inherit and therefore was not identified in her testing.  Therefore, it is important to remain in touch with cancer genetics in the future so that we can continue to offer Ms. Meaney the most up to date genetic testing.   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  At this time, it is unknown if this variant is associated with increased cancer risk or if this is a normal finding, but most variants such as this get reclassified to being inconsequential. It should not be used to make medical management decisions. With time, we suspect the lab will determine the significance of this variant, if any. If we do learn more about it, we will try to contact Ms. Isabell to discuss it further. However, it is important to stay in touch with [place] periodically and keep the address and phone number up to date.  We recommended Ms. Renato Battles pursue testing for the familial hereditary cancer gene mutation in the RAD51C gene, called c.97C>T. Ms. Barkalow test was normal and did not reveal the familial mutation. We call this result a true negative result because the cancer-causing mutation was identified in Ms. Petrasek's family, and she did not inherit it. Given this negative result, Ms. Lecy's chances of developing RAD51C-related cancers are the same as they are in the general population.    ADDITIONAL GENETIC TESTING: We discussed with Ms. Rocha that her genetic testing was fairly extensive.  If there are genes identified to increase cancer risk that can be analyzed in the future, we would be happy to discuss and coordinate this testing at that time.    CANCER SCREENING RECOMMENDATIONS: Ms. Tomczak test result is considered negative (normal). While reassuring, this does not definitively rule out a hereditary predisposition to  cancer. It is still possible that there could be genetic mutations that are undetectable by current technology. There could be genetic mutations in genes that have not been tested or identified to increase cancer risk. Therefore, it is recommended she continue to follow the cancer management and screening guidelines provided by her primary healthcare provider.   An individual's cancer risk and medical management are not determined by genetic test results alone. Overall cancer risk assessment incorporates additional factors, including personal medical history, family history, and any available genetic information that may result in a personalized plan for cancer prevention and surveillance.  Based on Ms. Corman's personal and family history, as well as her genetic test results, a statistical model Midwife) was used to estimate her risk of developing breast cancer. Tyrer-Cuzick estimates her lifetime risk of developing breast cancer to be approximately 22%. This lifetime breast cancer risk is a preliminary estimate based on available information using one of several models endorsed by the [place] (ACS). The ACS recommends consideration of breast MRI screening as an adjunct to mammography for patients at high risk (defined as 20% or greater lifetime risk). A more detailed breast cancer risk assessment can be considered, if clinically indicated.  We discussed that this risk estimate can change over time and that this calculation may be repeated to reflect new information  in her personal or family history in the future.   Ms. Warr has been determined to be at high risk for breast cancer. Therefore, we recommend that she proceed with the following breast cancer screening guidelines (per NCCN Breast Cancer Screening and Guidelines Version 1.2021):  Clinical encounter every 6-12 months - to begin when identified as being at increased risk, but not prior to age 71y  Annual screening  mammogram, consider tomosynthesis - to begin 10 years prior to when the youngest family member was diagnosed with breast cancer, not prior to age 73y, or age 103y (whichever comes first)  Annual breast MRI - to begin 10 years prior to when the youngest family member was diagnosed with breast cancer, not prior to age 9y, or age 58y (whichever comes first)  Consider risk reduction strategies  We discussed the option for Ms. Kott to be seen in the high risk breast cancer clinic to discuss her risk and establish a screening plan with which she is comfortable. Ms. Reimers is interested - we will send the referral.     RECOMMENDATIONS FOR FAMILY MEMBERS:  Individuals in this family might be at some increased risk of developing cancer, over the general population risk, simply due to the family history of cancer.  We recommended women in this family have a yearly mammogram beginning at age 4, or 72 years younger than the earliest onset of cancer, an annual clinical breast exam, and perform monthly breast self-exams. Women in this family should also have a gynecological exam as recommended by their primary provider. All family members should be referred for colonoscopy starting at age 42.  FOLLOW-UP: Lastly, we discussed with Ms. Kernodle that cancer genetics is a rapidly advancing field and it is possible that new genetic tests will be appropriate for her and/or her family members in the future. We encouraged her to remain in contact with cancer genetics on an annual basis so we can update her personal and family histories and let her know of advances in cancer genetics that may benefit this family.   Our contact number was provided. Ms. Walthall questions were answered to her satisfaction, and she knows she is welcome to call us at anytime with additional questions or concerns.   Clint Guy, MS, [facility] Genetic Counselor [email or web address] Phone: [phone]

## 2021-02-23 NOTE — Telephone Encounter (Signed)
Revealed negative genetic testing. She did not inherit the RAD51C mutation that had been identified in other family members. Reviewed breast cancer risk models and discussed that she has a high risk for breast cancer. Ms. Seeman is interested in being seen in the high risk breast clinic. We will place a referral.   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 Her result is still considered normal at this time and should not impact her medical management.

## 2021-03-02 ENCOUNTER — Telehealth: Payer: Self-pay | Admitting: Hematology and Oncology

## 2021-03-02 NOTE — Telephone Encounter (Signed)
Ms. Begeman returned my call and has been scheduled to see Dr. Al Pimple on 4/27 at 3:20pm. Pt needed a late afternoon appt. Aware to arrive 20 minutes early.

## 2021-03-21 ENCOUNTER — Inpatient Hospital Stay: Payer: No Typology Code available for payment source | Attending: Genetic Counselor | Admitting: Hematology and Oncology

## 2021-06-06 ENCOUNTER — Inpatient Hospital Stay: Payer: No Typology Code available for payment source | Attending: Genetic Counselor | Admitting: Hematology and Oncology

## 2021-06-06 ENCOUNTER — Inpatient Hospital Stay: Payer: No Typology Code available for payment source

## 2021-06-06 NOTE — Progress Notes (Deleted)
[facility] CONSULT NOTE  Patient Care Team: System, Provider Not In as PCP - General  CHIEF COMPLAINTS/PURPOSE OF CONSULTATION:  ***  ASSESSMENT & PLAN:  No problem-specific Assessment & Plan notes found for this encounter.  No orders of the defined types were placed in this encounter.  We discussed the role of chemotherapy. The intent is for cure.  We discussed some of the risks, benefits, side-effects of cisplatin. Some of the short term side-effects included, though not limited to, including weight loss, life threatening infections, risk of allergic reactions, need for transfusions of blood products, nausea, vomiting, change in bowel habits, loss of hair, admission to hospital for various reasons, and risks of death.   Long term side-effects are also discussed including risks of infertility, permanent damage to nerve function, hearing loss, chronic fatigue, kidney damage with possibility needing hemodialysis, and rare secondary malignancy including bone marrow disorders.  The patient is aware that the response rates discussed earlier is not guaranteed.  After a long discussion, patient made an informed decision to proceed with the prescribed plan of care and went ahead to sign the consent form today.   Patient education material was dispensed.  Treatment decision is based on NCCN guidelines, 100 mg/m2 every 3 weeks for total of 3 cycles with concurrent radiation treatment. 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 Burt Knack, M.D., Lucille Passy. Georjean Mode, Ph.D., Arlene A. Jasper Loser, M.D., Blase Mess, M.D., Darrick Penna. Megan Salon, M.D., [place] Jonna Munro, M.D., Domingo Cocking. Lyndon Code, M.D., Sallye Lat, M.D., Luciano Cutter, M.D., Polly Cobia, M.D., Minerva Ends, M.D., Sandre Kitty, M.D., K.S. Mariane Duval, M.D., Trinidad Curet, M.D., Meryl Dare, M.D., and Danae Orleans. Fu, M.D.  for the [facility] [address] [facility] 2004; [phone], 2004DOI: [id number]   HISTORY OF PRESENTING ILLNESS:  Danielle Fry 25 y.o. female is here because of High Risk for breast cancer.  REVIEW OF SYSTEMS:   Constitutional: Denies fevers, chills or abnormal night sweats Eyes: Denies blurriness of vision, double vision or watery eyes Ears, nose, mouth, throat, and face: Denies mucositis or sore throat Respiratory: Denies cough, dyspnea or wheezes Cardiovascular: Denies palpitation, chest discomfort or lower extremity swelling Gastrointestinal:  Denies nausea, heartburn or change in bowel habits Skin: Denies abnormal skin rashes Lymphatics: Denies new lymphadenopathy or easy bruising Neurological:Denies numbness, tingling or new weaknesses Behavioral/Psych: Mood is stable, no new changes  All other systems were reviewed with the patient and are negative.  MEDICAL HISTORY:  Past Medical History:  Diagnosis Date   Family history of breast cancer    Family history of gene mutation     SURGICAL HISTORY: No past surgical history on file.  SOCIAL HISTORY: Social History   Socioeconomic History   Marital status: Single    Spouse name: Not on file   Number of children: Not on file   Years of education: Not on file   Highest education level: Not on file  Occupational History   Not on file  Tobacco Use   Smoking status: Never   Smokeless tobacco: Never  Substance and Sexual Activity   Alcohol use: No   Drug use: No   Sexual activity: Not on file  Other Topics Concern   Not on file  Social History Narrative   Not on file   Social Determinants of Health   Financial Resource Strain: Not on file  Food  Insecurity: Not on file  Transportation Needs: Not on file  Physical Activity: Not on file  Stress: Not on file  Social Connections: Not on file  Intimate Partner Violence: Not on file    FAMILY HISTORY: Family History   Problem Relation Age of Onset   Breast cancer Mother 35   Other Half-Sister        RAD51C gene mutation positive   Other Maternal Aunt        RAD51C gene mutation positive   Other Sister        RAD51C gene mutation positive    ALLERGIES:  has No Known Allergies.  MEDICATIONS:  Current Outpatient Medications  Medication Sig Dispense Refill   acetaminophen (TYLENOL) 500 MG tablet Take 1,000 mg by mouth every 6 (six) hours as needed for mild pain, moderate pain or headache.     cyclobenzaprine (FLEXERIL) 10 MG tablet Take 1 tablet (10 mg total) by mouth 2 (two) times daily as needed for muscle spasms. 20 tablet 0   No current facility-administered medications for this visit.     PHYSICAL EXAMINATION: ECOG PERFORMANCE STATUS: {CHL ONC ECOG PS:[id number]}  There were no vitals filed for this visit. There were no vitals filed for this visit.  GENERAL:alert, no distress and comfortable SKIN: skin color, texture, turgor are normal, no rashes or significant lesions EYES: normal, conjunctiva are pink and non-injected, sclera clear OROPHARYNX:no exudate, no erythema and lips, buccal mucosa, and tongue normal  NECK: supple, thyroid normal size, non-tender, without nodularity LYMPH:  no palpable lymphadenopathy in the cervical, axillary or inguinal LUNGS: clear to auscultation and percussion with normal breathing effort HEART: regular rate & rhythm and no murmurs and no lower extremity edema ABDOMEN:abdomen soft, non-tender and normal bowel sounds Musculoskeletal:no cyanosis of digits and no clubbing  PSYCH: alert & oriented x 3 with fluent speech NEURO: no focal motor/sensory deficits  LABORATORY DATA:  I have reviewed the data as listed Lab Results  Component Value Date   WBC 6.9 02/23/2016   HGB 12.7 02/23/2016   HCT 38.1 02/23/2016   MCV 82.1 02/23/2016   PLT 143 (L) 02/23/2016     Chemistry      Component Value Date/Time   NA 136 02/22/2016 2033   K 3.6 02/22/2016  2033   CL 106 02/22/2016 2033   CO2 21 (L) 02/22/2016 2033   BUN <5 (L) 02/22/2016 2033   CREATININE 0.81 02/22/2016 2033      Component Value Date/Time   CALCIUM 9.1 02/22/2016 2033   ALKPHOS 58 02/22/2016 2033   AST 31 02/22/2016 2033   ALT 40 02/22/2016 2033   BILITOT 0.3 02/22/2016 2033       RADIOGRAPHIC STUDIES: I have personally reviewed the radiological images as listed and agreed with the findings in the report. No results found.  All questions were answered. The patient knows to call the clinic with any problems, questions or concerns. I spent *** minutes in the care of this patient including H and P, review of records, counseling and coordination of care.     Benay Pike, MD 06/06/2021 2:01 PM

## 2021-06-11 ENCOUNTER — Telehealth: Payer: Self-pay | Admitting: Hematology and Oncology

## 2021-06-11 NOTE — Telephone Encounter (Signed)
Danielle Fry has been cld and scheduled to see Dr. Pamelia Hoit on 8/15 at 345pm. Pt aware to arrive 30 minutes early.

## 2021-07-09 ENCOUNTER — Inpatient Hospital Stay: Payer: No Typology Code available for payment source | Attending: Genetic Counselor | Admitting: Hematology and Oncology

## 2021-07-09 DIAGNOSIS — Z9189 Other specified personal risk factors, not elsewhere classified: Secondary | ICD-10-CM | POA: Insufficient documentation

## 2021-07-09 NOTE — Assessment & Plan Note (Deleted)
Patient's mother was diagnosed with breast cancer at age 25, and she has 2 sisters who carry genes for breast, cervical, and ovarian cancers Assessment of risk: Tyrer-Cuzick Risk reduction: Breast cancer surveillance:
